# Patient Record
Sex: Male | Born: 1965
Health system: Southern US, Community
[De-identification: ages and names within clinical notes are randomized; demographics above are authoritative.]

---

## 2012-01-03 ENCOUNTER — Encounter (HOSPITAL_BASED_OUTPATIENT_CLINIC_OR_DEPARTMENT_OTHER): Payer: Self-pay | Admitting: Emergency Medicine

## 2012-01-03 ENCOUNTER — Emergency Department (HOSPITAL_BASED_OUTPATIENT_CLINIC_OR_DEPARTMENT_OTHER)
Admission: EM | Admit: 2012-01-03 | Discharge: 2012-01-03 | Disposition: A | Discharge status: Home / Self Care | Payer: PRIVATE HEALTH INSURANCE | Attending: Emergency Medicine | Admitting: Emergency Medicine

## 2012-01-03 ENCOUNTER — Emergency Department (INDEPENDENT_AMBULATORY_CARE_PROVIDER_SITE_OTHER): Payer: PRIVATE HEALTH INSURANCE

## 2012-01-03 DIAGNOSIS — S42213A Unspecified displaced fracture of surgical neck of unspecified humerus, initial encounter for closed fracture: Secondary | ICD-10-CM

## 2012-01-03 DIAGNOSIS — M25519 Pain in unspecified shoulder: Secondary | ICD-10-CM | POA: Insufficient documentation

## 2012-01-03 DIAGNOSIS — W010XXA Fall on same level from slipping, tripping and stumbling without subsequent striking against object, initial encounter: Secondary | ICD-10-CM | POA: Insufficient documentation

## 2012-01-03 DIAGNOSIS — M25529 Pain in unspecified elbow: Secondary | ICD-10-CM

## 2012-01-03 DIAGNOSIS — W19XXXA Unspecified fall, initial encounter: Secondary | ICD-10-CM

## 2012-01-03 DIAGNOSIS — Y93H2 Activity, gardening and landscaping: Secondary | ICD-10-CM | POA: Insufficient documentation

## 2012-01-03 MED ORDER — OXYCODONE-ACETAMINOPHEN 5-325 MG PO TABS: 2.0000 | ORAL_TABLET | ORAL | Status: AC | PRN

## 2012-01-03 MED ORDER — OXYCODONE-ACETAMINOPHEN 5-325 MG PO TABS
1.0000 | ORAL_TABLET | Freq: Once | ORAL | Status: AC
  Administered 2012-01-03: 2 via ORAL
  Filled 2012-01-03: qty 2

## 2012-01-03 NOTE — Discharge Instructions (Signed)
Humerus Fracture, Treated with Immobilization The humerus is the large bone in your upper arm. You have a broken (fractured) humerus. These fractures are easily diagnosed with X-rays. TREATMENT  Simple fractures which will heal without disability are treated with simple immobilization. Immobilization means you will wear a cast, splint, or sling. You have a fracture which will do well with immobilization. The fracture will heal well simply by being held in a good position until it is stable enough to begin range of motion exercises. Do not take part in activities which would further injure your arm.  HOME CARE INSTRUCTIONS   Put ice on the injured area.   Put ice in a plastic bag.   Place a towel between your skin and the bag.   Leave the ice on for 15 to 20 minutes, 3 to 4 times a day.   If you have a cast:   Do not scratch the skin under the cast using sharp or pointed objects.   Check the skin around the cast every day. You may put lotion on any red or sore areas.   Keep your cast dry and clean.   If you have a splint:   Wear the splint as directed.   Keep your splint dry and clean.   You may loosen the elastic around the splint if your fingers become numb, tingle, or turn cold or blue.   If you have a sling:   Wear the sling as directed.   Do not put pressure on any part of your cast or splint until it is fully hardened.   Your cast or splint can be protected during bathing with a plastic bag. Do not lower the cast or splint into water.   Only take over-the-counter or prescription medicines for pain, discomfort, or fever as directed by your caregiver.   Do range of motion exercises as instructed by your caregiver.   Follow up as directed by your caregiver. This is very important in order to avoid permanent injury or disability and chronic pain.  SEEK IMMEDIATE MEDICAL CARE IF:   Your skin or nails in the injured arm turn blue or gray.   Your arm feels cold or numb.     You develop severe pain in the injured arm.   You are having problems with the medicines you were given.  MAKE SURE YOU:   Understand these instructions.   Will watch your condition.   Will get help right away if you are not doing well or get worse.  Document Released: 12/29/2000 Document Revised: 09/11/2011 Document Reviewed: 11/06/2010 [employer] Patient Information 2012 [employer]. 

## 2012-01-03 NOTE — ED Provider Notes (Signed)
History     CSN: [id number]  Arrival date & time 01/03/12  1159   First MD Initiated Contact with Patient 01/03/12 1220      Chief Complaint  Patient presents with  . 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 male presenting with fall. The history is provided by the patient. No language interpreter was used.  Fall The accident occurred less than 1 hour ago. He landed on concrete. The point of impact was the right shoulder and right elbow. The pain is moderate. Pertinent negatives include no fever, no bowel incontinence, no vomiting, no headaches, no hearing loss, no loss of consciousness and no tingling. The symptoms are aggravated by activity.    History reviewed. No pertinent past medical history.  History reviewed. No pertinent past surgical history.  No family history on file.  History  Substance Use Topics  . Smoking status: Never Smoker   . Smokeless tobacco: Not on file  . Alcohol Use: Yes     social      Review of Systems  Constitutional: Negative for fever.  HENT: Negative.   Respiratory: Negative.   Cardiovascular: Negative.   Gastrointestinal: Negative for vomiting and bowel incontinence.  Skin: Negative.   Neurological: Negative for tingling, loss of consciousness and headaches.    Allergies  Review of patient's allergies indicates no known allergies.  Home Medications  No current outpatient prescriptions on file.  BP 106/70  Pulse 75  Temp(Src) 98 F (36.7 C) (Oral)  Resp 16  Ht 6' (1.829 m)  Wt 228 lb (103.42 kg)  BMI 30.92 kg/m2  SpO2 100%  Physical Exam  Nursing note and vitals reviewed. Constitutional: He is oriented  to person, place, and time. He appears well-developed and well-nourished.  HENT:  Head: Atraumatic.  Eyes: Conjunctivae and EOM are normal.  Neck: Normal range of motion. Neck supple.  Cardiovascular: Normal rate and regular rhythm.   Pulmonary/Chest: Effort normal and breath sounds normal.  Musculoskeletal:       Cervical back: Normal.       Thoracic back: Normal.       Lumbar back: Normal.       No obvious deformity or swelling noted to the right shoulder or elbow:pulses intact;pt unable to fully rotate shoulder due to pain  Neurological: He is alert and oriented to person, place, and time.    ED Course  Procedures (including critical care time)  Labs Reviewed - No data to display Dg Shoulder Right  01/03/2012  *RADIOLOGY REPORT*  Clinical Data: Shoulder injury complain of right-sided shoulder pain.  RIGHT SHOULDER - 2+ VIEW  Comparison: No priors.  Findings: Three views of the right shoulder demonstrate an acute mildly angulated fracture through the surgical neck of the right proximal humerus.  There may be some mild comminution in the region of the greater tuberosity.  The distal fracture fragment appears angulated laterally, however, some of this may be positional.  The humeral head appears located.  Right clavicle and right scapula appear intact.  IMPRESSION: 1.  Acute mildly comminuted and angulated fracture through the surgical neck of the right proximal humerus, as above.  Original Report Authenticated By: Florencia Reasons, M.D.   Dg  Elbow Complete Right  01/03/2012  *RADIOLOGY REPORT*  Clinical Data: History of fall complaining of right-sided elbow pain.  RIGHT ELBOW - COMPLETE 3+ VIEW  Comparison: No priors.  Findings: Three views of the right elbow demonstrate no definite acute fracture, subluxation, dislocation, joint or soft tissue abnormality.  IMPRESSION:  1.  No acute radiographic abnormality of the right elbow.  Original Report Authenticated By: Florencia Reasons, M.D.      1. Humerus surgical neck fracture       MDM  12:44 PM Pt denies the need for pain medication at this time 2:28 PM Pt now requesting something for pain, will give:pt to be placed in sling by nursing staff:will refer to ortho and give a script for percocet       Teressa Lower, NP 01/03/12 1431

## 2012-01-03 NOTE — ED Provider Notes (Signed)
Medical screening examination/treatment/procedure(s) were performed by non-physician practitioner and as supervising physician I was immediately available for consultation/collaboration.   Nat Christen, MD 01/03/12 [phone]

## 2012-01-03 NOTE — ED Notes (Signed)
Pt c/o RT shoulder pain s/p slipping on trailer while loading with leaves

## 2012-01-06 ENCOUNTER — Other Ambulatory Visit: Payer: Self-pay | Admitting: Orthopedic Surgery

## 2012-01-06 ENCOUNTER — Ambulatory Visit
Admission: RE | Admit: 2012-01-06 | Discharge: 2012-01-06 | Disposition: A | Discharge status: Home / Self Care | Payer: PRIVATE HEALTH INSURANCE | Source: Ambulatory Visit | Attending: Orthopedic Surgery | Admitting: Orthopedic Surgery

## 2012-01-06 DIAGNOSIS — M25511 Pain in right shoulder: Secondary | ICD-10-CM

## 2012-02-13 ENCOUNTER — Ambulatory Visit: Payer: PRIVATE HEALTH INSURANCE | Attending: Orthopedic Surgery | Admitting: Physical Therapy

## 2012-02-13 DIAGNOSIS — M25519 Pain in unspecified shoulder: Secondary | ICD-10-CM | POA: Insufficient documentation

## 2012-02-13 DIAGNOSIS — M25619 Stiffness of unspecified shoulder, not elsewhere classified: Secondary | ICD-10-CM | POA: Insufficient documentation

## 2012-02-13 DIAGNOSIS — IMO0001 Reserved for inherently not codable concepts without codable children: Secondary | ICD-10-CM | POA: Insufficient documentation

## 2012-02-16 ENCOUNTER — Ambulatory Visit: Payer: PRIVATE HEALTH INSURANCE | Admitting: Physical Therapy

## 2012-02-20 ENCOUNTER — Ambulatory Visit: Payer: PRIVATE HEALTH INSURANCE | Admitting: Physical Therapy

## 2012-02-27 ENCOUNTER — Ambulatory Visit: Payer: PRIVATE HEALTH INSURANCE | Admitting: Physical Therapy

## 2012-03-03 ENCOUNTER — Ambulatory Visit: Payer: PRIVATE HEALTH INSURANCE | Admitting: Physical Therapy

## 2012-03-08 ENCOUNTER — Ambulatory Visit: Payer: PRIVATE HEALTH INSURANCE | Attending: Orthopedic Surgery | Admitting: Physical Therapy

## 2012-03-08 DIAGNOSIS — M25619 Stiffness of unspecified shoulder, not elsewhere classified: Secondary | ICD-10-CM | POA: Insufficient documentation

## 2012-03-08 DIAGNOSIS — M25519 Pain in unspecified shoulder: Secondary | ICD-10-CM | POA: Insufficient documentation

## 2012-03-08 DIAGNOSIS — IMO0001 Reserved for inherently not codable concepts without codable children: Secondary | ICD-10-CM | POA: Insufficient documentation

## 2012-03-12 ENCOUNTER — Ambulatory Visit: Payer: PRIVATE HEALTH INSURANCE | Admitting: Physical Therapy

## 2012-03-15 ENCOUNTER — Ambulatory Visit: Payer: PRIVATE HEALTH INSURANCE | Admitting: Physical Therapy

## 2012-03-19 ENCOUNTER — Ambulatory Visit: Payer: PRIVATE HEALTH INSURANCE | Admitting: Physical Therapy

## 2012-03-26 ENCOUNTER — Ambulatory Visit: Payer: PRIVATE HEALTH INSURANCE | Admitting: Physical Therapy

## 2014-05-15 ENCOUNTER — Ambulatory Visit
Admission: RE | Admit: 2014-05-15 | Discharge: 2014-05-15 | Disposition: A | Discharge status: Home / Self Care | Payer: PRIVATE HEALTH INSURANCE | Source: Ambulatory Visit | Attending: Family Medicine | Admitting: Family Medicine

## 2014-05-15 ENCOUNTER — Other Ambulatory Visit: Payer: Self-pay | Admitting: Family Medicine

## 2014-05-15 DIAGNOSIS — M79602 Pain in left arm: Secondary | ICD-10-CM

## 2014-05-24 ENCOUNTER — Other Ambulatory Visit: Payer: Self-pay | Admitting: Family Medicine

## 2014-05-24 DIAGNOSIS — M5412 Radiculopathy, cervical region: Secondary | ICD-10-CM

## 2014-05-24 DIAGNOSIS — M5030 Other cervical disc degeneration, unspecified cervical region: Secondary | ICD-10-CM

## 2014-05-26 ENCOUNTER — Ambulatory Visit
Admission: RE | Admit: 2014-05-26 | Discharge: 2014-05-26 | Disposition: A | Discharge status: Home / Self Care | Payer: PRIVATE HEALTH INSURANCE | Source: Ambulatory Visit | Attending: Family Medicine | Admitting: Family Medicine

## 2014-05-26 DIAGNOSIS — M5412 Radiculopathy, cervical region: Secondary | ICD-10-CM

## 2014-05-26 DIAGNOSIS — M5030 Other cervical disc degeneration, unspecified cervical region: Secondary | ICD-10-CM

## 2015-10-04 IMAGING — CR DG CERVICAL SPINE COMPLETE 4+V
7 series · 7 of 7 positions shown · non-contrast
Comparison: None.

CLINICAL DATA: Numbness and tingling in the left over low for 6
weeks, no trauma

EXAM:
CERVICAL SPINE  4+ VIEWS

[view not recorded (1 of 7)]
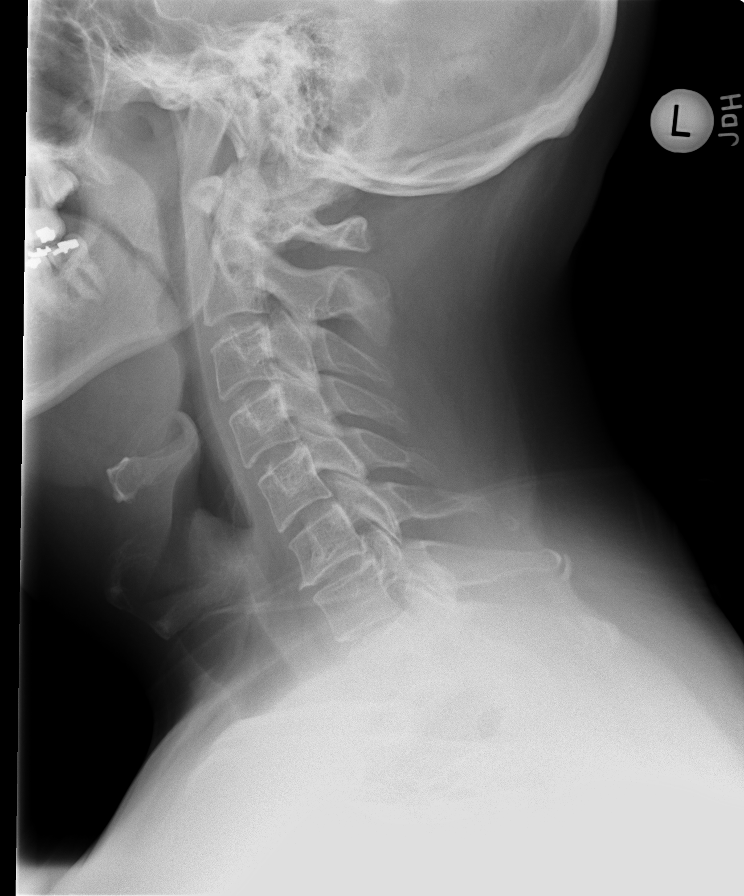

[view not recorded (2 of 7)]
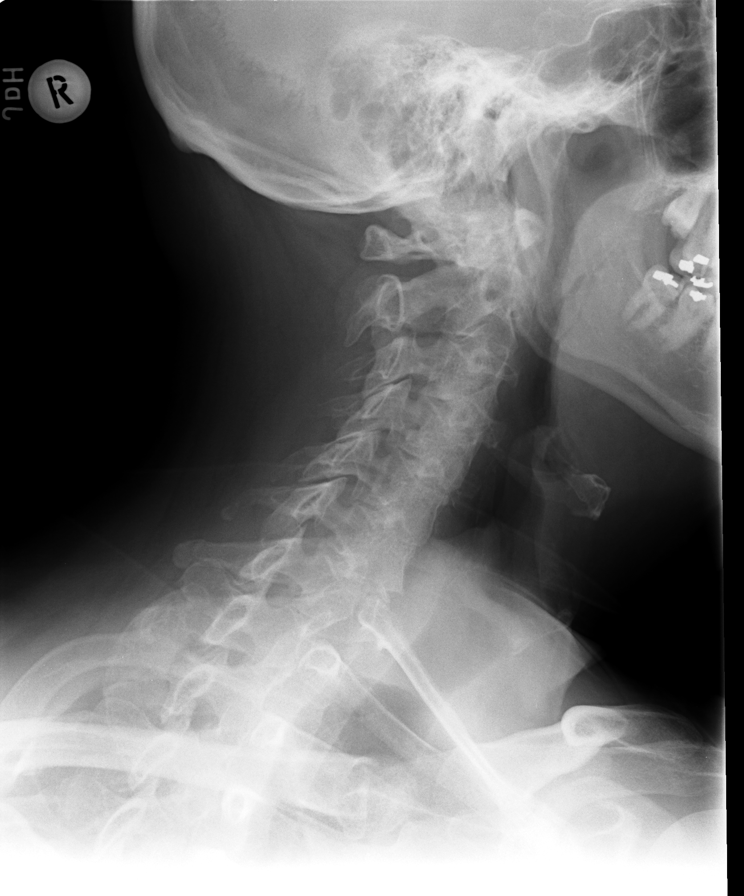

[view not recorded (3 of 7)]
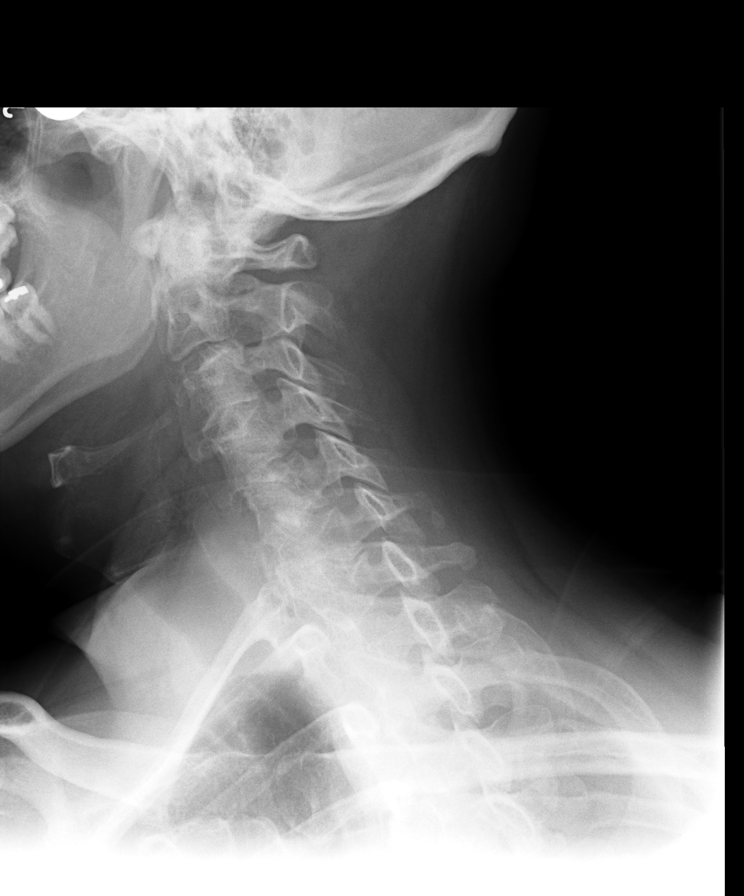

[view not recorded (4 of 7)]
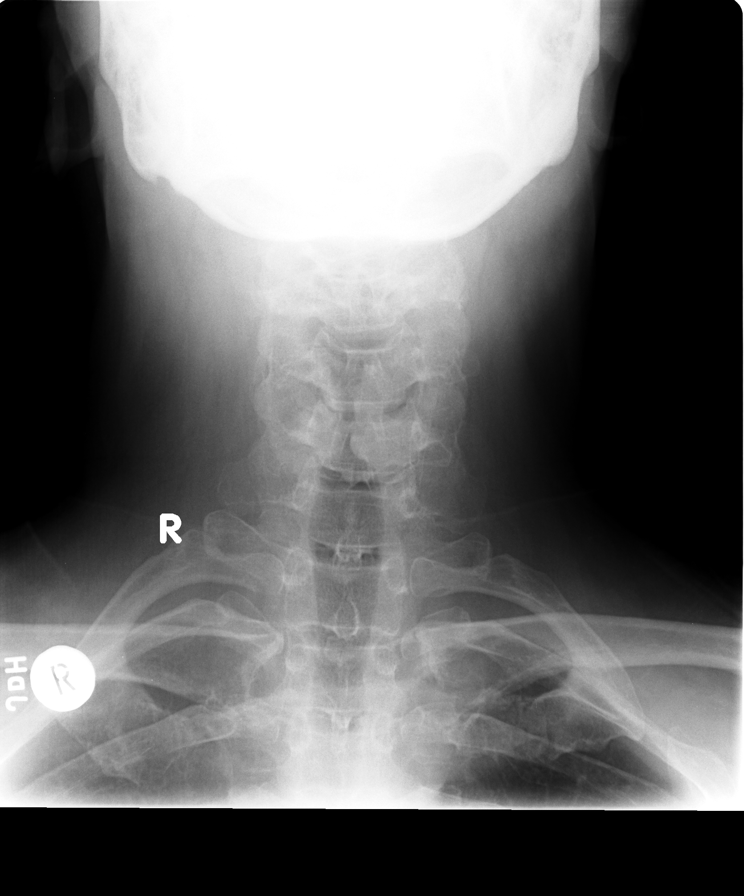

[view not recorded (5 of 7)]
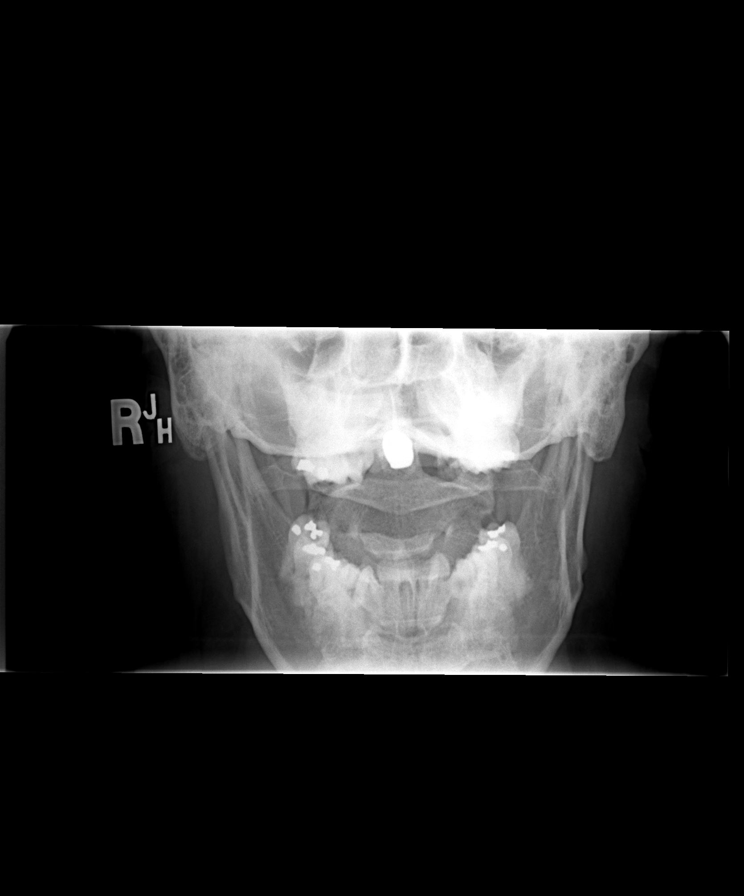

[view not recorded (6 of 7)]
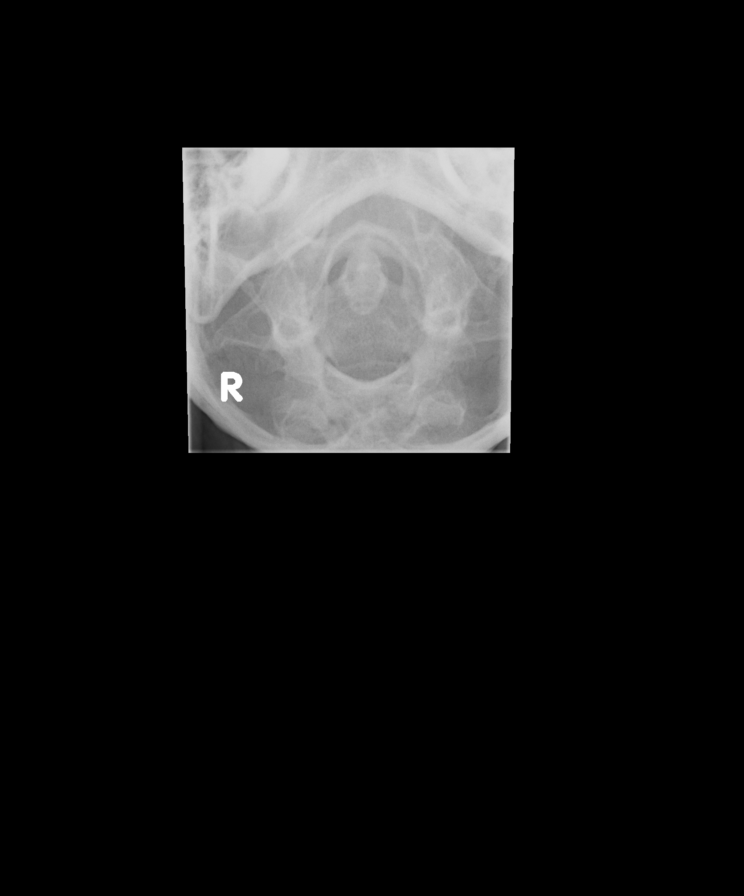

[view not recorded (7 of 7)]
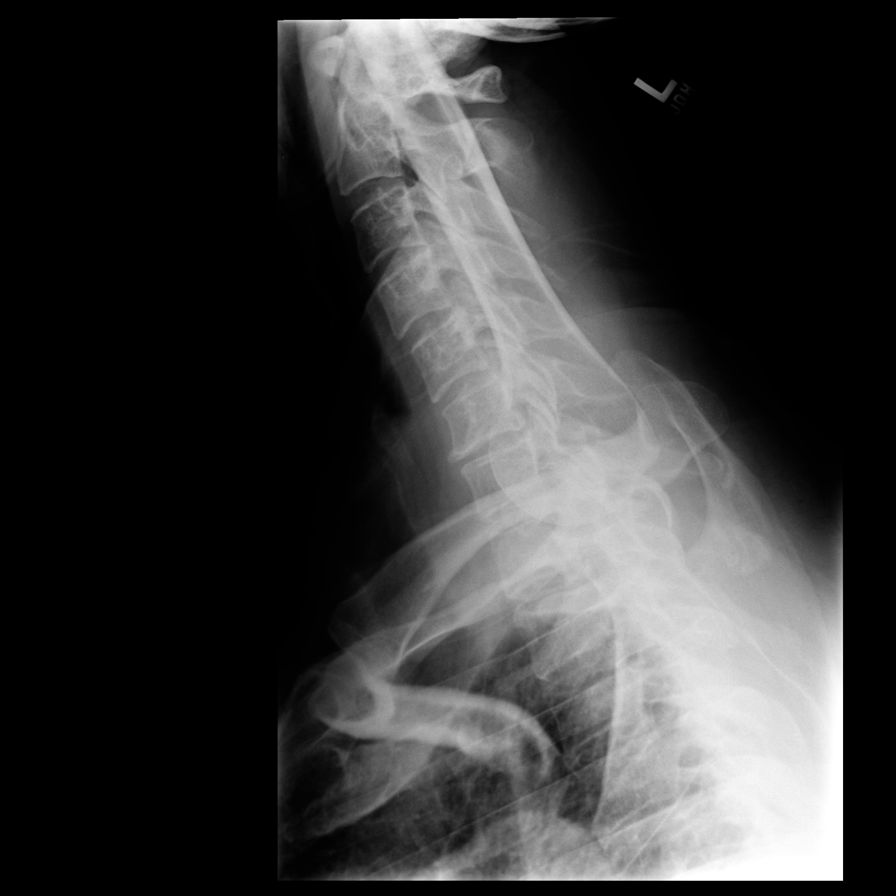

[7 of 7 positions shown; findings below may reference images not displayed]

FINDINGS: The cervical vertebrae are in normal alignment. There is
degenerative disc disease at C6-7 where there is loss of disc space
and mild sclerosis with spurring. No prevertebral soft tissue
swelling is seen. On oblique views, minimal foraminal narrowing is
present at C6-7 bilaterally. The odontoid process is intact and the
lung apices appear clear.
IMPRESSION: Normal alignment with degenerative disc disease at C6-7 with there
is mild foraminal narrowing present bilaterally.

## 2020-05-15 DIAGNOSIS — M722 Plantar fascial fibromatosis: Secondary | ICD-10-CM | POA: Diagnosis not present

## 2020-05-15 DIAGNOSIS — E538 Deficiency of other specified B group vitamins: Secondary | ICD-10-CM | POA: Diagnosis not present

## 2020-07-04 DIAGNOSIS — Z131 Encounter for screening for diabetes mellitus: Secondary | ICD-10-CM | POA: Diagnosis not present

## 2020-07-04 DIAGNOSIS — E559 Vitamin D deficiency, unspecified: Secondary | ICD-10-CM | POA: Diagnosis not present

## 2020-07-04 DIAGNOSIS — Z23 Encounter for immunization: Secondary | ICD-10-CM | POA: Diagnosis not present

## 2020-07-04 DIAGNOSIS — E538 Deficiency of other specified B group vitamins: Secondary | ICD-10-CM | POA: Diagnosis not present

## 2020-07-04 DIAGNOSIS — Z125 Encounter for screening for malignant neoplasm of prostate: Secondary | ICD-10-CM | POA: Diagnosis not present

## 2020-07-04 DIAGNOSIS — Z0000 Encounter for general adult medical examination without abnormal findings: Secondary | ICD-10-CM | POA: Diagnosis not present

## 2020-07-04 DIAGNOSIS — E785 Hyperlipidemia, unspecified: Secondary | ICD-10-CM | POA: Diagnosis not present

## 2020-08-15 DIAGNOSIS — L578 Other skin changes due to chronic exposure to nonionizing radiation: Secondary | ICD-10-CM | POA: Diagnosis not present

## 2020-08-15 DIAGNOSIS — L821 Other seborrheic keratosis: Secondary | ICD-10-CM | POA: Diagnosis not present

## 2020-10-12 DIAGNOSIS — Z03818 Encounter for observation for suspected exposure to other biological agents ruled out: Secondary | ICD-10-CM | POA: Diagnosis not present

## 2020-10-12 DIAGNOSIS — J988 Other specified respiratory disorders: Secondary | ICD-10-CM | POA: Diagnosis not present

## 2020-11-05 ENCOUNTER — Ambulatory Visit (INDEPENDENT_AMBULATORY_CARE_PROVIDER_SITE_OTHER): Payer: BC Managed Care – PPO

## 2020-11-05 ENCOUNTER — Other Ambulatory Visit: Payer: Self-pay

## 2020-11-05 ENCOUNTER — Ambulatory Visit: Payer: BC Managed Care – PPO | Admitting: Podiatry

## 2020-11-05 DIAGNOSIS — M722 Plantar fascial fibromatosis: Secondary | ICD-10-CM

## 2020-11-05 DIAGNOSIS — M79671 Pain in right foot: Secondary | ICD-10-CM

## 2020-11-05 DIAGNOSIS — E785 Hyperlipidemia, unspecified: Secondary | ICD-10-CM | POA: Insufficient documentation

## 2020-11-05 DIAGNOSIS — M7731 Calcaneal spur, right foot: Secondary | ICD-10-CM | POA: Diagnosis not present

## 2020-11-05 DIAGNOSIS — E538 Deficiency of other specified B group vitamins: Secondary | ICD-10-CM | POA: Insufficient documentation

## 2020-11-05 DIAGNOSIS — E669 Obesity, unspecified: Secondary | ICD-10-CM | POA: Insufficient documentation

## 2020-11-05 DIAGNOSIS — D7589 Other specified diseases of blood and blood-forming organs: Secondary | ICD-10-CM | POA: Insufficient documentation

## 2020-11-05 DIAGNOSIS — E559 Vitamin D deficiency, unspecified: Secondary | ICD-10-CM | POA: Insufficient documentation

## 2020-11-05 MED ORDER — MELOXICAM 15 MG PO TABS: 15.0000 mg | ORAL_TABLET | Freq: Every day | ORAL | 0 refills | Status: AC

## 2020-11-05 MED ORDER — TRIAMCINOLONE ACETONIDE 10 MG/ML IJ SUSP
10.0000 mg | Freq: Once | INTRAMUSCULAR | Status: AC
  Administered 2020-11-05: 10 mg

## 2020-11-05 NOTE — Progress Notes (Signed)
Subjective:   Patient ID: Terry Burnett, male   DOB: 55 y.o.   MRN: [id number]   HPI 55 year old male presents the office today for concerns of right heel pain which started back in August to the bottom of the heel.  He denies any recent injury or trauma but seems to be worse in the last couple weeks.  No recent injury.  Try to change shoes, inserts he has purchased several things to hold his foot without any improvement.  No other concerns today.   Review of Systems  All other systems reviewed and are negative.  No past medical history on file.  No past surgical history on file.   Current Outpatient Medications:  .  meloxicam (MOBIC) 15 MG tablet, Take 1 tablet (15 mg total) by mouth daily., Disp: 30 tablet, Rfl: 0 .  loratadine (CLARITIN) 10 MG tablet, 1 tablet, Disp: , Rfl:   No Known Allergies       Objective:  Physical Exam  General: AAO x3, NAD  Dermatological: Skin is warm, dry and supple bilateral.  There are no open sores, no preulcerative lesions, no rash or signs of infection present.  Vascular: Dorsalis Pedis artery and Posterior Tibial artery pedal pulses are 2/4 bilateral with immedate capillary fill time. There is no pain with calf compression, swelling, warmth, erythema.   Neruologic: Grossly intact via light touch bilateral. Negative tinel sign.  Musculoskeletal: Tenderness to palpation along the plantar medial tubercle of the calcaneus at the insertion of plantar fascia on the right foot. There is no pain along the course of the plantar fascia within the arch of the foot. Plantar fascia appears to be intact. There is no pain with lateral compression of the calcaneus or pain with vibratory sensation. There is no pain along the course or insertion of the achilles tendon. No other areas of tenderness to bilateral lower extremities.Muscular strength 5/5 in all groups tested bilateral.  Gait: Unassisted, Nonantalgic.       Assessment:   Right heel pain, plantar  fasciitis     Plan:  -Treatment options discussed including all alternatives, risks, and complications -Etiology of symptoms were discussed -X-rays were obtained and reviewed with the patient.  No evidence of acute fracture or stress fracture identified today.  Small calcaneal spurring present. -Injection performed.  See procedure note below. -Plantar fasical brace and night splint -Prescribed mobic. Discussed side effects of the medication and directed to stop if any are to occur and call the office.  -Continue stretching, icing daily.  Discussed modifications and orthotics.  Procedure: Injection Tendon/Ligament Discussed alternatives, risks, complications and verbal consent was obtained.  Location: RIGHT plantar fascia at the glabrous junction; medial approach. Skin Prep: Alcohol  Injectate: 0.5cc 0.5% marcaine plain, 0.5 cc 2% lidocaine plain and, 1 cc kenalog 10. Disposition: Patient tolerated procedure well. Injection site dressed with a band-aid.  Post-injection care was discussed and return precautions discussed.   Return in about 4 weeks (around 12/03/2020).  Vivi Barrack DPM

## 2020-11-05 NOTE — Patient Instructions (Signed)
For instructions on how to put on your Night Splint, please visit [email or web address]   Plantar Fasciitis (Heel Spur Syndrome) with Rehab The plantar fascia is a fibrous, ligament-like, soft-tissue structure that spans the bottom of the foot. Plantar fasciitis is a condition that causes pain in the foot due to inflammation of the tissue. SYMPTOMS   Pain and tenderness on the underneath side of the foot.  Pain that worsens with standing or walking. CAUSES  Plantar fasciitis is caused by irritation and injury to the plantar fascia on the underneath side of the foot. Common mechanisms of injury include:  Direct trauma to bottom of the foot.  Damage to a small nerve that runs under the foot where the main fascia attaches to the heel bone.  Stress placed on the plantar fascia due to bone spurs. RISK INCREASES WITH:   Activities that place stress on the plantar fascia (running, jumping, pivoting, or cutting).  Poor strength and flexibility.  Improperly fitted shoes.  Tight calf muscles.  Flat feet.  Failure to warm-up properly before activity.  Obesity. PREVENTION  Warm up and stretch properly before activity.  Allow for adequate recovery between workouts.  Maintain physical fitness:  Strength, flexibility, and endurance.  Cardiovascular fitness.  Maintain a health body weight.  Avoid stress on the plantar fascia.  Wear properly fitted shoes, including arch supports for individuals who have flat feet.  PROGNOSIS  If treated properly, then the symptoms of plantar fasciitis usually resolve without surgery. However, occasionally surgery is necessary.  RELATED COMPLICATIONS   Recurrent symptoms that may result in a chronic condition.  Problems of the lower back that are caused by compensating for the injury, such as limping.  Pain or weakness of the foot during push-off following surgery.  Chronic inflammation, scarring, and partial or complete fascia tear,  occurring more often from repeated injections.  TREATMENT  Treatment initially involves the use of ice and medication to help reduce pain and inflammation. The use of strengthening and stretching exercises may help reduce pain with activity, especially stretches of the Achilles tendon. These exercises may be performed at home or with a therapist. Your caregiver may recommend that you use heel cups of arch supports to help reduce stress on the plantar fascia. Occasionally, corticosteroid injections are given to reduce inflammation. If symptoms persist for greater than 6 months despite non-surgical (conservative), then surgery may be recommended.   MEDICATION   If pain medication is necessary, then nonsteroidal anti-inflammatory medications, such as aspirin and ibuprofen, or other minor pain relievers, such as acetaminophen, are often recommended.  Do not take pain medication within 7 days before surgery.  Prescription pain relievers may be given if deemed necessary by your caregiver. Use only as directed and only as much as you need.  Corticosteroid injections may be given by your caregiver. These injections should be reserved for the most serious cases, because they may only be given a certain number of times.  HEAT AND COLD  Cold treatment (icing) relieves pain and reduces inflammation. Cold treatment should be applied for 10 to 15 minutes every 2 to 3 hours for inflammation and pain and immediately after any activity that aggravates your symptoms. Use ice packs or massage the area with a piece of ice (ice massage).  Heat treatment may be used prior to performing the stretching and strengthening activities prescribed by your caregiver, physical therapist, or athletic trainer. Use a heat pack or soak the injury in warm water.  SEEK IMMEDIATE MEDICAL CARE   IF:  Treatment seems to offer no benefit, or the condition worsens.  Any medications produce adverse side effects.  EXERCISES- RANGE OF  MOTION (ROM) AND STRETCHING EXERCISES - Plantar Fasciitis (Heel Spur Syndrome) These exercises may help you when beginning to rehabilitate your injury. Your symptoms may resolve with or without further involvement from your physician, physical therapist or athletic trainer. While completing these exercises, remember:   Restoring tissue flexibility helps normal motion to return to the joints. This allows healthier, less painful movement and activity.  An effective stretch should be held for at least 30 seconds.  A stretch should never be painful. You should only feel a gentle lengthening or release in the stretched tissue.  RANGE OF MOTION - Toe Extension, Flexion  Sit with your right / left leg crossed over your opposite knee.  Grasp your toes and gently pull them back toward the top of your foot. You should feel a stretch on the bottom of your toes and/or foot.  Hold this stretch for 10 seconds.  Now, gently pull your toes toward the bottom of your foot. You should feel a stretch on the top of your toes and or foot.  Hold this stretch for 10 seconds. Repeat  times. Complete this stretch 3 times per day.   RANGE OF MOTION - Ankle Dorsiflexion, Active Assisted  Remove shoes and sit on a chair that is preferably not on a carpeted surface.  Place right / left foot under knee. Extend your opposite leg for support.  Keeping your heel down, slide your right / left foot back toward the chair until you feel a stretch at your ankle or calf. If you do not feel a stretch, slide your bottom forward to the edge of the chair, while still keeping your heel down.  Hold this stretch for 10 seconds. Repeat 3 times. Complete this stretch 2 times per day.   STRETCH  Gastroc, Standing  Place hands on wall.  Extend right / left leg, keeping the front knee somewhat bent.  Slightly point your toes inward on your back foot.  Keeping your right / left heel on the floor and your knee straight, shift  your weight toward the wall, not allowing your back to arch.  You should feel a gentle stretch in the right / left calf. Hold this position for 10 seconds. Repeat 3 times. Complete this stretch 2 times per day.  STRETCH  Soleus, Standing  Place hands on wall.  Extend right / left leg, keeping the other knee somewhat bent.  Slightly point your toes inward on your back foot.  Keep your right / left heel on the floor, bend your back knee, and slightly shift your weight over the back leg so that you feel a gentle stretch deep in your back calf.  Hold this position for 10 seconds. Repeat 3 times. Complete this stretch 2 times per day.  STRETCH  Gastrocsoleus, Standing  Note: This exercise can place a lot of stress on your foot and ankle. Please complete this exercise only if specifically instructed by your caregiver.   Place the ball of your right / left foot on a step, keeping your other foot firmly on the same step.  Hold on to the wall or a rail for balance.  Slowly lift your other foot, allowing your body weight to press your heel down over the edge of the step.  You should feel a stretch in your right / left calf.  Hold this position   for 10 seconds.  Repeat this exercise with a slight bend in your right / left knee. Repeat 3 times. Complete this stretch 2 times per day.   STRENGTHENING EXERCISES - Plantar Fasciitis (Heel Spur Syndrome)  These exercises may help you when beginning to rehabilitate your injury. They may resolve your symptoms with or without further involvement from your physician, physical therapist or athletic trainer. While completing these exercises, remember:   Muscles can gain both the endurance and the strength needed for everyday activities through controlled exercises.  Complete these exercises as instructed by your physician, physical therapist or athletic trainer. Progress the resistance and repetitions only as guided.  STRENGTH - Towel Curls  Sit in  a chair positioned on a non-carpeted surface.  Place your foot on a towel, keeping your heel on the floor.  Pull the towel toward your heel by only curling your toes. Keep your heel on the floor. Repeat 3 times. Complete this exercise 2 times per day.  STRENGTH - Ankle Inversion  Secure one end of a rubber exercise band/tubing to a fixed object (table, pole). Loop the other end around your foot just before your toes.  Place your fists between your knees. This will focus your strengthening at your ankle.  Slowly, pull your big toe up and in, making sure the band/tubing is positioned to resist the entire motion.  Hold this position for 10 seconds.  Have your muscles resist the band/tubing as it slowly pulls your foot back to the starting position. Repeat 3 times. Complete this exercises 2 times per day.  Document Released: 09/22/2005 Document Revised: 12/15/2011 Document Reviewed: 01/04/2009 [employer] Patient Information 2014 [employer].  

## 2020-12-03 ENCOUNTER — Other Ambulatory Visit: Payer: Self-pay

## 2020-12-03 ENCOUNTER — Ambulatory Visit: Payer: BC Managed Care – PPO | Admitting: Podiatry

## 2020-12-03 DIAGNOSIS — M722 Plantar fascial fibromatosis: Secondary | ICD-10-CM

## 2020-12-03 DIAGNOSIS — M7731 Calcaneal spur, right foot: Secondary | ICD-10-CM | POA: Diagnosis not present

## 2020-12-03 NOTE — Patient Instructions (Signed)

## 2020-12-05 NOTE — Progress Notes (Signed)
Subjective: 55 year old male presents the office today for follow-up evaluation of plantar fasciitis in the right side.  States he is doing much better.  Currently not expensing much discomfort.  No complications with steroid injection.  Is been doing the stretching as well as using the braces.  He has no other concerns today. Denies any systemic complaints such as fevers, chills, nausea, vomiting. No acute changes since last appointment, and no other complaints at this time.   Objective: AAO x3, NAD DP/PT pulses palpable bilaterally, CRT less than 3 seconds At this time there is no significant discomfort on the plantar medial tubercle of the calcaneus at the insertion plantar fascia.  There is no areas of discomfort.  No pain with lateral compression calcaneus.  MMT 5/5.  No edema, erythema.  No pain with calf compression, swelling, warmth, erythema  Assessment: 55 year old male with resolving plantar fasciitis  Plan: -All treatment options discussed with the patient including all alternatives, risks, complications.  -Overall doing well with no significant discomfort today.  Continue stretching, icing discussed the medications orthotics.  Discussed measures to help prevent reoccurrence. -Patient encouraged to call the office with any questions, concerns, change in symptoms.   Vivi Barrack DPM

## 2021-07-10 DIAGNOSIS — Z125 Encounter for screening for malignant neoplasm of prostate: Secondary | ICD-10-CM | POA: Diagnosis not present

## 2021-07-10 DIAGNOSIS — Z0000 Encounter for general adult medical examination without abnormal findings: Secondary | ICD-10-CM | POA: Diagnosis not present

## 2021-07-10 DIAGNOSIS — Z131 Encounter for screening for diabetes mellitus: Secondary | ICD-10-CM | POA: Diagnosis not present

## 2021-07-10 DIAGNOSIS — E538 Deficiency of other specified B group vitamins: Secondary | ICD-10-CM | POA: Diagnosis not present

## 2021-07-10 DIAGNOSIS — Z23 Encounter for immunization: Secondary | ICD-10-CM | POA: Diagnosis not present

## 2021-07-10 DIAGNOSIS — E785 Hyperlipidemia, unspecified: Secondary | ICD-10-CM | POA: Diagnosis not present

## 2021-08-21 DIAGNOSIS — L570 Actinic keratosis: Secondary | ICD-10-CM | POA: Diagnosis not present

## 2021-08-21 DIAGNOSIS — L821 Other seborrheic keratosis: Secondary | ICD-10-CM | POA: Diagnosis not present

## 2021-08-21 DIAGNOSIS — L578 Other skin changes due to chronic exposure to nonionizing radiation: Secondary | ICD-10-CM | POA: Diagnosis not present

## 2021-08-21 DIAGNOSIS — E538 Deficiency of other specified B group vitamins: Secondary | ICD-10-CM | POA: Diagnosis not present

## 2021-09-04 DIAGNOSIS — U099 Post covid-19 condition, unspecified: Secondary | ICD-10-CM | POA: Diagnosis not present

## 2021-09-04 DIAGNOSIS — R053 Chronic cough: Secondary | ICD-10-CM | POA: Diagnosis not present

## 2021-09-26 DIAGNOSIS — E538 Deficiency of other specified B group vitamins: Secondary | ICD-10-CM | POA: Diagnosis not present

## 2021-10-24 DIAGNOSIS — E538 Deficiency of other specified B group vitamins: Secondary | ICD-10-CM | POA: Diagnosis not present

## 2021-11-22 DIAGNOSIS — E538 Deficiency of other specified B group vitamins: Secondary | ICD-10-CM | POA: Diagnosis not present

## 2021-12-23 DIAGNOSIS — E538 Deficiency of other specified B group vitamins: Secondary | ICD-10-CM | POA: Diagnosis not present

## 2022-01-17 DIAGNOSIS — E538 Deficiency of other specified B group vitamins: Secondary | ICD-10-CM | POA: Diagnosis not present

## 2022-02-14 DIAGNOSIS — E538 Deficiency of other specified B group vitamins: Secondary | ICD-10-CM | POA: Diagnosis not present

## 2023-09-08 ENCOUNTER — Other Ambulatory Visit (HOSPITAL_BASED_OUTPATIENT_CLINIC_OR_DEPARTMENT_OTHER): Payer: Self-pay | Admitting: Family Medicine

## 2023-09-08 DIAGNOSIS — E7849 Other hyperlipidemia: Secondary | ICD-10-CM

## 2023-10-02 ENCOUNTER — Ambulatory Visit (HOSPITAL_BASED_OUTPATIENT_CLINIC_OR_DEPARTMENT_OTHER)
Admission: RE | Admit: 2023-10-02 | Discharge: 2023-10-02 | Disposition: A | Discharge status: Home / Self Care | Payer: Self-pay | Source: Ambulatory Visit | Attending: Family Medicine | Admitting: Family Medicine

## 2023-10-02 DIAGNOSIS — E7849 Other hyperlipidemia: Secondary | ICD-10-CM | POA: Insufficient documentation
